# Patient Record
Sex: Male | Born: 1985 | Hispanic: Yes | Marital: Single
Health system: Southern US, Community
[De-identification: ages and names within clinical notes are randomized; demographics above are authoritative.]

## PROBLEM LIST (undated history)

## (undated) DIAGNOSIS — R7989 Other specified abnormal findings of blood chemistry: Secondary | ICD-10-CM

## (undated) HISTORY — DX: Other specified abnormal findings of blood chemistry: R79.89

---

## 2018-11-24 ENCOUNTER — Ambulatory Visit
Admission: RE | Admit: 2018-11-24 | Discharge: 2018-11-24 | Disposition: A | Discharge status: Home / Self Care | Payer: BC Managed Care – PPO | Source: Ambulatory Visit | Attending: Family Medicine | Admitting: Family Medicine

## 2018-11-24 ENCOUNTER — Other Ambulatory Visit: Payer: Self-pay | Admitting: Family Medicine

## 2018-11-24 DIAGNOSIS — R042 Hemoptysis: Secondary | ICD-10-CM

## 2019-01-23 ENCOUNTER — Other Ambulatory Visit: Payer: Self-pay

## 2019-01-23 ENCOUNTER — Other Ambulatory Visit: Payer: Self-pay | Admitting: Family Medicine

## 2019-01-23 ENCOUNTER — Ambulatory Visit
Admission: RE | Admit: 2019-01-23 | Discharge: 2019-01-23 | Disposition: A | Discharge status: Home / Self Care | Payer: BC Managed Care – PPO | Source: Ambulatory Visit | Attending: Family Medicine | Admitting: Family Medicine

## 2019-01-23 DIAGNOSIS — M7989 Other specified soft tissue disorders: Secondary | ICD-10-CM

## 2019-01-23 DIAGNOSIS — M79641 Pain in right hand: Secondary | ICD-10-CM

## 2019-01-23 DIAGNOSIS — M25431 Effusion, right wrist: Secondary | ICD-10-CM

## 2019-03-15 ENCOUNTER — Telehealth: Payer: Self-pay | Admitting: Neurology

## 2019-03-15 NOTE — Telephone Encounter (Signed)

## 2019-03-20 ENCOUNTER — Encounter: Payer: Self-pay | Admitting: Neurology

## 2019-03-20 NOTE — Telephone Encounter (Signed)
I called pt to update his chart. No answer, left a message asking him to call me back. 

## 2019-03-20 NOTE — Telephone Encounter (Signed)
I called pt. Pt's meds, allergies, and PMH were updated.  Pt has never has a sleep study but does endorse snoring.  Pt does not know what he weighs. He thinks it is about 300 lbs. He is 5'11.  Pt was instructed on how to measure his neck size prior to the appt tomorrow.  Pt understands the doxy.me process.  Epworth Sleepiness Scale 0= would never doze 1= slight chance of dozing 2= moderate chance of dozing 3= high chance of dozing  Sitting and reading: 2 Watching TV: 1 Sitting inactive in a public place (ex. Theater or meeting): 1 As a passenger in a car for an hour without a break: 1 Lying down to rest in the afternoon: 2 Sitting and talking to someone: 0 Sitting quietly after lunch (no alcohol): 1 In a car, while stopped in traffic: 0 Total: 8  FSS:17

## 2019-03-21 ENCOUNTER — Ambulatory Visit (INDEPENDENT_AMBULATORY_CARE_PROVIDER_SITE_OTHER): Payer: BC Managed Care – PPO | Admitting: Neurology

## 2019-03-21 ENCOUNTER — Other Ambulatory Visit: Payer: Self-pay

## 2019-03-21 DIAGNOSIS — R519 Headache, unspecified: Secondary | ICD-10-CM

## 2019-03-21 DIAGNOSIS — G479 Sleep disorder, unspecified: Secondary | ICD-10-CM

## 2019-03-21 DIAGNOSIS — R51 Headache: Secondary | ICD-10-CM | POA: Diagnosis not present

## 2019-03-21 DIAGNOSIS — E669 Obesity, unspecified: Secondary | ICD-10-CM | POA: Diagnosis not present

## 2019-03-21 DIAGNOSIS — G4726 Circadian rhythm sleep disorder, shift work type: Secondary | ICD-10-CM

## 2019-03-21 DIAGNOSIS — R0683 Snoring: Secondary | ICD-10-CM

## 2019-03-21 NOTE — Progress Notes (Signed)
[name], MD, PhD [facility] [address], [address] [place], [place] [address]   Virtual Visit via Video Note on 03/21/19  I connected with Albert Ramos on 03/21/19 at  3:30 PM EDT by a video enabled telemedicine application and verified that I am speaking with the correct person using two identifiers.   I discussed the limitations of evaluation and management by telemedicine and the availability of in person appointments. The patient expressed understanding and agreed to proceed.  History of Present Illness: Mr. Albert Ramos is a 33 year old right-handed gentleman with an underlying medical history of hypogonadism, and obesity, who presents for a virtual, video based appointment via doxy.me for evaluation of his sleep disorder, in particular, concern for underlying obstructive sleep apnea.  The patient is unaccompanied today.  He joins via cell phone from home, I am located in my office.  He is referred by his primary care physician, Dr. Rachell Cipro and I reviewed her telemedicine note from 03/07/2019 he reports snoring, Epworth sleepiness score is 8 out of 24, fatigue severity score is 17 out of 63.  He reports snoring but it is generally mild, his fiance is in the background adding some more information.  He has been working night shift, works from 6 PM to 6 AM generally speaking, he works for the department of [employer], [employer].  Bedtime is generally between 7 and 8 AM and usually he would wake up in the afternoon somewhere around 2 PM but lately he has woken up after 2 or 3 hours, sleep is interrupted.  He denies any night to night nocturia or having to use the bathroom once he goes to bed, he denies restless leg symptoms but has woken up with a headache at times.  He has been working night shift for the past year at least.  He has not tried any over-the-counter medication except for the occasional Benadryl.  He is not aware of any family history of  OSA.  He was not able to measure his neck circumference, he estimates that his latest weight was somewhere between 290 and 300 pounds.  He has gained weight in the recent past.  He drinks caffeine and limitation, 1 cup of coffee per day on average.    His Past Medical History Is Significant For: Past Medical History:  Diagnosis Date   Low testosterone     His Past Surgical History Is Significant For:   His Family History Is Significant For: Family History  Problem Relation Age of Onset   Diabetes Mother     His Social History Is Significant For: Social History   Socioeconomic History   Marital status: Single    Spouse name: Not on file   Number of children: Not on file   Years of education: Not on file   Highest education level: Not on file  Occupational History   Not on file  Social Needs   Financial resource strain: Not on file   Food insecurity:    Worry: Not on file    Inability: Not on file   Transportation needs:    Medical: Not on file    Non-medical: Not on file  Tobacco Use   Smoking status: Not on file  Substance and Sexual Activity   Alcohol use: Not on file   Drug use: Not on file   Sexual activity: Not on file  Lifestyle   Physical activity:    Days per week: Not on file  Minutes per session: Not on file   Stress: Not on file  Relationships   Social connections:    Talks on phone: Not on file    Gets together: Not on file    Attends religious service: Not on file    Active member of club or organization: Not on file    Attends meetings of clubs or organizations: Not on file    Relationship status: Not on file  Other Topics Concern   Not on file  Social History Narrative   Not on file    His Allergies Are:  No Known Allergies:   His Current Medications Are:  Outpatient Encounter Medications as of 03/21/2019  Medication Sig   albuterol (VENTOLIN HFA) 108 (90 Base) MCG/ACT inhaler Inhale into the lungs every 6 (six)  hours as needed for wheezing or shortness of breath.   diclofenac sodium (VOLTAREN) 1 % GEL Apply topically 4 (four) times daily.   meloxicam (MOBIC) 7.5 MG tablet Take 7.5 mg by mouth daily.   montelukast (SINGULAIR) 10 MG tablet Take 10 mg by mouth at bedtime.   testosterone (ANDROGEL) 50 MG/5GM (1%) GEL Place 5 g onto the skin daily.   No facility-administered encounter medications on file as of 03/21/2019.   :   Review of Systems:  Out of a complete 14 point review of systems, all are reviewed and negative with the exception of these symptoms as listed below:  Observations/Objective: On examination, he is pleasant and conversant, in no acute distress, comprehension is good, language skills unremarkable, speech is clear without dysarthria, hypophonia or voice tremor noted, airway examination reveals moderately crowded airway secondary to tonsillar size of about 2+, small airway entry noted, otherwise benign findings, no overbite. Neck mobility grossly intact, face is symmetric with normal facial animation noted, extraocular movements well preserved in all directions.  Upper body muscle bulk and coordination and movements grossly intact, he is able to walk around without problems.   Assessment and Plan: Albert Ramos is a 33 year old right-handed gentleman with an underlying medical history of hypogonadism, and obesity, who presents for a virtual, video based appointment via doxy.me for evaluation of his sleep disorder, in particular, concern for underlying obstructive sleep apnea. The patient's medical history and physical exam (albeit limited with current video-based evaluation) are concerning for a diagnosis of obstructive sleep apnea. I discussed with the patient the diagnosis of OSA, its prognosis and treatment options. I explained in particular the risks and ramifications of untreated moderate to severe OSA, especially with respect to developing cardiovascular disease down  the Road, including congestive heart failure, difficult to treat hypertension, cardiac arrhythmias, or stroke. Even type 2 diabetes has, in part, been linked to untreated OSA. Symptoms of untreated OSA may include daytime sleepiness, memory problems, mood irritability and mood disorder such as depression and anxiety, lack of energy, as well as recurrent headaches, especially morning headaches. We talked about the importance of weight control. We talked about the importance of maintaining good sleep hygiene. I recommended the following at this time: home sleep test.  I explained the sleep test procedure to the patient and also outlined possible treatment options of OSA, including the use of a custom-made dental device (which would require a referral to a specialist dentist), upper airway surgical options, (such as UPPP, which would involve a referral to an ENT). I also explained the CPAP vs. AutoPAP treatment option to the patient, who indicated that he would be willing to try CPAP if the  need arises. I answered all his questions today and the patient was in agreement. I plan to see the patient back after the sleep study is completed and encouraged him to call with any interim questions, concerns, problems or updates.   Huston FoleySaima Bruce Churilla, MD, PhD    Follow Up Instructions:    I discussed the assessment and treatment plan with the patient. The patient was provided an opportunity to ask questions and all were answered. The patient agreed with the plan and demonstrated an understanding of the instructions.   The patient was advised to call back or seek an in-person evaluation if the symptoms worsen or if the condition fails to improve as anticipated.  I provided 30 minutes of non-face-to-face time during this encounter.   Huston FoleySaima Evann Erazo, MD

## 2019-03-22 ENCOUNTER — Encounter: Payer: Self-pay | Admitting: Neurology

## 2019-03-22 NOTE — Patient Instructions (Signed)
Given verbally, during today's virtual video-based encounter, with verbal feedback received.   

## 2019-04-09 ENCOUNTER — Other Ambulatory Visit: Payer: Self-pay | Admitting: Family Medicine

## 2019-04-09 DIAGNOSIS — R1084 Generalized abdominal pain: Secondary | ICD-10-CM

## 2019-04-10 ENCOUNTER — Ambulatory Visit (INDEPENDENT_AMBULATORY_CARE_PROVIDER_SITE_OTHER): Payer: BC Managed Care – PPO | Admitting: Neurology

## 2019-04-10 DIAGNOSIS — R0683 Snoring: Secondary | ICD-10-CM

## 2019-04-10 DIAGNOSIS — E669 Obesity, unspecified: Secondary | ICD-10-CM

## 2019-04-10 DIAGNOSIS — G4726 Circadian rhythm sleep disorder, shift work type: Secondary | ICD-10-CM

## 2019-04-10 DIAGNOSIS — G479 Sleep disorder, unspecified: Secondary | ICD-10-CM

## 2019-04-10 DIAGNOSIS — G478 Other sleep disorders: Secondary | ICD-10-CM | POA: Diagnosis not present

## 2019-04-10 DIAGNOSIS — R519 Headache, unspecified: Secondary | ICD-10-CM

## 2019-04-12 NOTE — Procedures (Signed)
PATIENT'S NAME:  Albert, Ramos DOB:      1985/12/07      MR#:    [id number]     DATE OF RECORDING: 04/10/2019 REFERRING M.D.:  Rachell Cipro, MD Study Performed:   Baseline Polysomnogram HISTORY: 33 year old man with a history of hypogonadism, and obesity, who reports snoring and sleep disruption. The patient endorsed the Epworth Sleepiness Scale at 8/24 points. 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 The patient's weight 290 pounds with a height of 72 (inches), resulting in a BMI of 39.4 kg/m2. The patient's neck circumference measured 18 inches.  CURRENT MEDICATIONS: Albuterol, Diclofenac Sodium, Meloxicam, Montelukast and Testosterone.   PROCEDURE:  This is a multichannel digital polysomnogram utilizing the Somnostar 11.2 system.  Electrodes and sensors were applied and monitored per AASM Specifications.   EEG, EOG, Chin and Limb EMG, were sampled at 200 Hz.  ECG, Snore and Nasal Pressure, Thermal Airflow, Respiratory Effort, CPAP Flow and Pressure, Oximetry was sampled at 50 Hz. Digital video and audio were recorded.      BASELINE STUDY  Lights Out was at 08:07 and Lights On at 14:27.  Total recording time (TRT) was 381 minutes, with a total sleep time (TST) of 317 minutes.   The patient's sleep latency was 3 minutes.  REM latency was 50.5 minutes, which is mildly reduced. The sleep efficiency was 83.2 %.     SLEEP ARCHITECTURE: WASO (Wake after sleep onset) was 59.5 minutes with mild sleep fragmentation noted. There were 12 minutes in Stage N1, 173 minutes Stage N2, 66.5 minutes Stage N3 and 65.5 minutes in Stage REM.  The percentage of Stage N1 was 3.8%, Stage N2 was 54.6%, which is normal, Stage N3 was 21.%, which is normal, and Stage R (REM sleep) was 20.7%, which is normal. The arousals were noted as: 26 were spontaneous, 0 were associated with PLMs, 11 were associated with respiratory events.  RESPIRATORY ANALYSIS:  There were a total of 21 respiratory events:   0 obstructive apneas, 0 central apneas and 0 mixed apneas with a total of 0 apneas and an apnea index (AI) of 0 /hour. There were 21 hypopneas with a hypopnea index of 4. /hour. The patient also had 0 respiratory event related arousals (RERAs).      The total APNEA/HYPOPNEA INDEX (AHI) was 4. /hour and the total RESPIRATORY DISTURBANCE INDEX was 4. /hour.  15 events occurred in REM sleep and 12 events in NREM. The REM AHI was 13.7 /hour, versus a non-REM AHI of 1.4. The patient spent 16.5 minutes of total sleep time in the supine position and 301 minutes in non-supine.. The supine AHI was 7.3 versus a non-supine AHI of 3.8.  OXYGEN SATURATION & C02:  The Wake baseline 02 saturation was 94%, with the lowest being 87%. Time spent below 89% saturation equaled 1 minutes.   PERIODIC LIMB MOVEMENTS: The patient had a total of 0 Periodic Limb Movements.  The Periodic Limb Movement (PLM) index was 0 and the PLM Arousal index was 0/hour. Audio and video analysis did not show any abnormal or unusual movements, behaviors, phonations or vocalizations. The patient took 1 bathroom break. Mild to moderate snoring was noted. The EKG was in keeping with normal sinus rhythm (NSR).   Post-study, the patient indicated that sleep was the same as usual.   IMPRESSION:  1. Primary Snoring  RECOMMENDATIONS:  1. This study does not demonstrate any significant obstructive or central sleep disordered breathing with the exception  of mild to moderate snoring and mild, REM and supine related OSA. For this, treatment with positive airway pressure is not warranted. Weight loss and the avoidance of the supine sleep position is recommended. 2. The patient should be cautioned not to drive, work at heights, or operate dangerous or heavy equipment when tired or sleepy. Review and reiteration of good sleep hygiene measures should be pursued with any patient. 3. The patient will be advised to follow up with the referring provider, who  will be notified of the test results.  I certify that I have reviewed the entire raw data recording prior to the issuance of this report in accordance with the Standards of Accreditation of the American Academy of Sleep Medicine (AASM)  Huston FoleySaima Yamen Castrogiovanni, MD, PhD Diplomat, American Board of Neurology and Sleep Medicine (Neurology and Sleep Medicine)

## 2019-04-17 ENCOUNTER — Telehealth: Payer: Self-pay

## 2019-04-17 DIAGNOSIS — G4726 Circadian rhythm sleep disorder, shift work type: Secondary | ICD-10-CM

## 2019-04-17 DIAGNOSIS — G4710 Hypersomnia, unspecified: Secondary | ICD-10-CM

## 2019-04-17 NOTE — Telephone Encounter (Signed)
PT return call back from RN. Please call [phone].

## 2019-04-17 NOTE — Progress Notes (Signed)
Patient referred by Dr. Ernie Hew, seen by me on 03/21/19 in VV, diagnostic PSG on 04/10/19, daytime d/t shift work Hx.   Please call and notify the patient that the recent sleep study did not show any significant obstructive or central sleep apnea, with the exception of mild to moderate snoring and mild, REM and supine related OSA. For this, treatment with positive airway pressure is not warranted. Weight loss and the avoidance of the supine sleep position is recommended. Please remind patient to try to maintain good sleep hygiene, which means: Keep a regular sleep and wake schedule and make enough time for sleep (7 1/2 to 8 1/2 hours for the average adult), try not to exercise or have a meal within 2 hours of your bedtime, try to keep your bedroom conducive for sleep, that is, cool and dark, without light distractors such as an illuminated alarm clock, and refrain from watching TV right before sleep or in the middle of the night and do not keep the TV or radio on during the night. If a nightlight is used, have it away from the visual field. Also, try not to use or play on electronic devices at bedtime, such as your cell phone, tablet PC or laptop. If you like to read at bedtime on an electronic device, try to dim the background light as much as possible. Do not eat in the middle of the night. Keep pets away from the bedroom environment. For stress relief, try meditation, deep breathing exercises (there are many books and CDs available), a white noise machine or fan can help to diffuse other noise distractors, such as traffic noise. Do not drink alcohol before bedtime, as it can disturb sleep and cause middle of the night awakenings. Never mix alcohol and sedating medications! Avoid narcotic pain medication close to bedtime, as opioids/narcotics can suppress breathing drive and breathing effort.    He can FU with Dr. Ernie Hew.  Thanks,  [name], MD, PhD [facility])

## 2019-04-17 NOTE — Telephone Encounter (Addendum)
I called pt. I advised pt that Dr. Rexene Alberts reviewed pt's sleep study and found that does not have any significant osa. Dr. Rexene Alberts recommends that pt avoid sleeping on his back and pursue weight loss. I reviewed sleep hygiene recommendations with the pt, including trying to keep a regular sleep wake schedule, avoiding electronics in the bedroom, keeping the bedroom cool, dark, and quiet, and avoiding eating or exercising within 2 hours of bedtime as well as eating in the middle of the night. I advised pt to keep pets out of the bedroom. I discussed with pt the importance of stress relief and to try meditation, deep breathing exercises, and/or a white noise machine or fan to diffuse other noise distractors. I advised pt to not drink alcohol before bedtime and to never mix alcohol and sedating medications. Pt was advised to avoid narcotic pain medication close to bedtime. I advised pt that a copy of these sleep study results will be sent to Dr. Ernie Hew. Pt verbalized understanding of results.  Pt wants to know if he can take something to help with his sleepiness at work. 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

## 2019-04-17 NOTE — Telephone Encounter (Signed)
I called pt to discuss his sleep study results. No answer, left a message asking him to call me back. 

## 2019-04-17 NOTE — Telephone Encounter (Signed)
-----   Message from [name], MD sent at 04/17/2019  8:03 AM EDT ----- Patient referred by Dr. Ernie Hew, seen by me on 03/21/19 in VV, diagnostic PSG on 04/10/19, daytime d/t shift work Hx.   Please call and notify the patient that the recent sleep study did not show any significant obstructive or central sleep apnea, with the exception of mild to moderate snoring and mild, REM and supine related OSA. For this, treatment with positive airway pressure is not warranted. Weight loss and the avoidance of the supine sleep position is recommended. Please remind patient to try to maintain good sleep hygiene, which means: Keep a regular sleep and wake schedule and make enough time for sleep (7 1/2 to 8 1/2 hours for the average adult), try not to exercise or have a meal within 2 hours of your bedtime, try to keep your bedroom conducive for sleep, that is, cool and dark, without light distractors such as an illuminated alarm clock, and refrain from watching TV right before sleep or in the middle of the night and do not keep the TV or radio on during the night. If a nightlight is used, have it away from the visual field. Also, try not to use or play on electronic devices at bedtime, such as your cell phone, tablet PC or laptop. If you like to read at bedtime on an electronic device, try to dim the background light as much as possible. Do not eat in the middle of the night. Keep pets away from the bedroom environment. For stress relief, try meditation, deep breathing exercises (there are many books and CDs available), a white noise machine or fan can help to diffuse other noise distractors, such as traffic noise. Do not drink alcohol before bedtime, as it can disturb sleep and cause middle of the night awakenings. Never mix alcohol and sedating medications! Avoid narcotic pain medication close to bedtime, as opioids/narcotics can suppress breathing drive and breathing effort.    He can FU with Dr. Ernie Hew.  Thanks,  [name], MD, PhD [facility])

## 2019-04-18 NOTE — Telephone Encounter (Signed)
I would recommend he increase his sleep time to 8 hours per day and if this does not work, we can consider bringing him in for a PSG and next day MSLT to look for an organic sleepiness disorder. Please advise patient, we can submit for a new sleep study request, looking for underlying sleepiness disorder.

## 2019-04-18 NOTE — Addendum Note (Signed)
Addended by: [name] on: 04/18/2019 02:18 PM   Modules accepted: Orders

## 2019-04-18 NOTE — Telephone Encounter (Signed)
I called pt and discussed this with him. He has tried to increase his sleep time to 8 hours and feels that this does not work. He would be agreeable to a PSG and MSLT if his insurance will cover it. I advised him that our sleep lab will submit a request with insurance for this.

## 2019-04-18 NOTE — Telephone Encounter (Signed)
I will order psg/mslt.

## 2019-04-20 ENCOUNTER — Other Ambulatory Visit: Payer: BC Managed Care – PPO

## 2019-04-27 ENCOUNTER — Ambulatory Visit
Admission: RE | Admit: 2019-04-27 | Discharge: 2019-04-27 | Disposition: A | Discharge status: Home / Self Care | Payer: BC Managed Care – PPO | Source: Ambulatory Visit | Attending: Family Medicine | Admitting: Family Medicine

## 2019-04-27 DIAGNOSIS — R1084 Generalized abdominal pain: Secondary | ICD-10-CM

## 2019-05-21 ENCOUNTER — Ambulatory Visit (INDEPENDENT_AMBULATORY_CARE_PROVIDER_SITE_OTHER): Payer: BC Managed Care – PPO | Admitting: Neurology

## 2019-05-21 DIAGNOSIS — G4726 Circadian rhythm sleep disorder, shift work type: Secondary | ICD-10-CM

## 2019-05-21 DIAGNOSIS — G4720 Circadian rhythm sleep disorder, unspecified type: Secondary | ICD-10-CM

## 2019-05-21 DIAGNOSIS — G4733 Obstructive sleep apnea (adult) (pediatric): Secondary | ICD-10-CM

## 2019-05-21 DIAGNOSIS — G4710 Hypersomnia, unspecified: Secondary | ICD-10-CM

## 2019-05-28 ENCOUNTER — Telehealth: Payer: Self-pay

## 2019-05-28 NOTE — Addendum Note (Signed)
Addended by: [name] on: 05/28/2019 08:08 AM   Modules accepted: Orders

## 2019-05-28 NOTE — Telephone Encounter (Signed)
-----   Message from [name], MD sent at 05/28/2019  8:08 AM EDT ----- Patient referred by Dr. Ernie Hew, seen by me on 03/21/19, diagnostic PSG during the day recently neg. For OSA, repeat study requested with next day MSLT, on 05/21/19.    Please call and notify the patient that the recent sleep study showed mild OSA. We canceled MSLT due to TST of less than 6 h and he took flexeril prior to study. OSA is overall mild, but worth treating to see if he feels better after treatment. To that end I recommend treatment for this in the form of autoPAP, which means, that we don't have to bring him back for a second sleep study with CPAP, but will let him try an autoPAP machine at home, through a DME company (of his choice, or as per insurance requirement). The DME representative will educate him on how to use the machine, how to put the mask on, etc. I have placed an order in the chart. Please send referral, talk to patient, send report to referring MD. We will need a FU in sleep clinic for 10 weeks post-PAP set up, please arrange that with me or one of our NPs. Thanks,   [name], MD, PhD [facility])

## 2019-05-28 NOTE — Procedures (Signed)
PATIENT'S NAME:  Albert Ramos, [place] DOB:      [birth year]      MR#:    [id number]     DATE OF RECORDING: 05/21/2019 REFERRING M.D.:  Rachell Cipro, MD Study Performed:   Baseline Polysomnogram HISTORY: 33 year old man with a history of hypogonadism, and obesity, who presents for a repeat sleep study and next day MSLT for his daytime sleepiness. He had a recent daytime sleep study. He is a shift Insurance underwriter. The patient endorsed the Epworth Sleepiness Scale at 8 points. The patient's weight 291 pounds with a height of 72 (inches), resulting in a BMI of 39.4 kg/m2. The patient's neck circumference measured 18 inches.  CURRENT MEDICATIONS: Ventolin, Voltaren, Mobic, Singulair, Androgel   PROCEDURE:  This is a multichannel digital polysomnogram utilizing the Somnostar 11.2 system.  Electrodes and sensors were applied and monitored per AASM Specifications.   EEG, EOG, Chin and Limb EMG, were sampled at 200 Hz.  ECG, Snore and Nasal Pressure, Thermal Airflow, Respiratory Effort, CPAP Flow and Pressure, Oximetry was sampled at 50 Hz. Digital video and audio were recorded.      BASELINE STUDY  The patient took generic Flexeil prior to study start. Lights Out was at 21:56 and Lights On at 05:50.  Total recording time (TRT) was 474.5 minutes, with a total sleep time (TST) of 326.5 minutes.   The patient's sleep latency was 9 minutes.  REM latency was 172 minutes, which is delayed. The sleep efficiency was 68.8%, which is reduced.     SLEEP ARCHITECTURE: WASO (Wake after sleep onset) was 100.5 minutes.  There were 63.5 minutes in Stage N1, 190.5 minutes Stage N2, 45.5 minutes Stage N3 and 27 minutes in Stage REM.  The percentage of Stage N1 was 19.4%, which is markedly increased, Stage N2 was 58.3%, which is increased, Stage N3 was 13.9% and Stage R (REM sleep) was 8.3%, which is reduced.   RESPIRATORY ANALYSIS:  There were a total of 33 respiratory events:  0 obstructive apneas, 0 central apneas and 0 mixed  apneas with a total of 0 apneas and an apnea index (AI) of 0 /hour. There were 33 hypopneas with a hypopnea index of 6.1 /hour. The patient also had 0 respiratory event related arousals (RERAs).      The total APNEA/HYPOPNEA INDEX (AHI) was 6.1 /hour and the total RESPIRATORY DISTURBANCE INDEX was 0. 6.1 /hour.  6 events occurred in REM sleep and 54 events in NREM. The REM AHI was 13.3 /hour, versus a non-REM AHI of 5.4. The patient spent 51 minutes of total sleep time in the supine position and 276 minutes in non-supine.. The supine AHI was 18.8 versus a non-supine AHI of 3.7.  OXYGEN SATURATION & C02:  The Wake baseline 02 saturation was 97%, with the lowest being 89%. Time spent below 89% saturation equaled 0 minutes.   PERIODIC LIMB MOVEMENTS: The patient had a total of 27 Periodic Limb Movements.  The Periodic Limb Movement (PLM) index was 5. and the PLM Arousal index was .7/hour. The arousals were noted as: 67 were spontaneous, 4 were associated with PLMs, 17 were associated with respiratory events.  Audio and video analysis did not show any abnormal or unusual movements, behaviors, phonations or vocalizations. The patient took 1 bathroom break. Mild to moderate snoring was noted. The EKG was in keeping with normal sinus rhythm (NSR). Post-study, the patient indicated that sleep was the same as usual.   IMPRESSION:  1. Obstructive Sleep Apnea (OSA) 2. Dysfunctions  associated with sleep stages or arousal from sleep  RECOMMENDATIONS:  1. This study demonstrates overall mild obstructive sleep apnea (more pronounced in REM sleep and in the supine position) with an AHI of 6.1/hour, and O2 nadir of 89%. The patient was scheduled for a next day MSLT/nap study; however, due to a total sleep time of less than 6 hours and due to the patient taking flexeril before lights out, the next day MSLT was canceled. Given the patient's sleep related complaints, treatment of his mild OSA with positive airway  pressure is recommended; this can be achieved in the form of autoPAP. Alternatively, a full-night CPAP titration study would allow optimization of therapy if needed. Other treatment options may include avoidance of supine sleep position along with weight loss, upper airway or jaw surgery in selected patients or the use of an oral appliance in certain patients. ENT evaluation and/or consultation with a maxillofacial surgeon or dentist may be feasible in some instances.    2. Please note that untreated obstructive sleep apnea may carry additional perioperative morbidity. Patients with significant obstructive sleep apnea should receive perioperative PAP therapy and the surgeons and particularly the anesthesiologist should be informed of the diagnosis and the severity of the sleep disordered breathing. 3. This study shows sleep fragmentation and abnormal sleep stage percentages; these are nonspecific findings and per se do not signify an intrinsic sleep disorder or a cause for the patient's sleep-related symptoms. Causes include (but are not limited to) the first night effect of the sleep study, circadian rhythm disturbances, medication effect or an underlying mood disorder or medical problem.  4. The patient should be cautioned not to drive, work at heights, or operate dangerous or heavy equipment when tired or sleepy. Review and reiteration of good sleep hygiene measures should be pursued with any patient. 5. The patient will be seen in follow[facility] for discussion of the test results and further management strategies. The referring provider will be notified of the test results.  I certify that I have reviewed the entire raw data recording prior to the issuance of this report in accordance with the Standards of Accreditation of the American Academy of Sleep Medicine (AASM)  Huston FoleySaima Armoni Kludt, MD, PhD Diplomat, American Board of Neurology and Sleep Medicine (Neurology and Sleep Medicine)

## 2019-05-28 NOTE — Telephone Encounter (Signed)
I called pt. I advised pt that Dr. Rexene Alberts reviewed their sleep study results and found that pt has mild osa. Dr. Rexene Alberts recommends that pt start an auto pap at home. I reviewed PAP compliance expectations with the pt. Pt is agreeable to starting an auto-PAP. I advised pt that an order will be sent to a DME, [facility], and [facility] will call the pt within about one week after they file with the pt's insurance. [facility] will show the pt how to use the machine, fit for masks, and troubleshoot the auto-PAP if needed. Pt was unable to schedule his follow up at this time but will call us back. I advised him of the importance and insurance requirement of a 31-90 day follow up with our office after starting auto pap. A letter with all of this information in it will be mailed to the pt as a reminder. I verified with the pt that the address we have on file is correct. Pt verbalized understanding of results. Pt had no questions at this time but was encouraged to call back if questions arise. I have sent the order to [employer] and have received confirmation that they have received the order.

## 2019-05-28 NOTE — Progress Notes (Signed)
Patient referred by Dr. Ernie Hew, seen by me on 03/21/19, diagnostic PSG during the day recently neg. For OSA, repeat study requested with next day MSLT, on 05/21/19.    Please call and notify the patient that the recent sleep study showed mild OSA. We canceled MSLT due to TST of less than 6 h and he took flexeril prior to study. OSA is overall mild, but worth treating to see if he feels better after treatment. To that end I recommend treatment for this in the form of autoPAP, which means, that we don't have to bring him back for a second sleep study with CPAP, but will let him try an autoPAP machine at home, through a DME company (of his choice, or as per insurance requirement). The DME representative will educate him on how to use the machine, how to put the mask on, etc. I have placed an order in the chart. Please send referral, talk to patient, send report to referring MD. We will need a FU in sleep clinic for 10 weeks post-PAP set up, please arrange that with me or one of our NPs. Thanks,   Albert Age, MD, PhD [facility])

## 2019-08-23 NOTE — Telephone Encounter (Signed)
Received notice from [facility] that pt could not afford a cpap so they offered to give him a free machine with 1 set of free supplies but pt has declined this.  I called pt to discuss this. No answer, left a message asking him to call me back.

## 2021-12-10 ENCOUNTER — Emergency Department (HOSPITAL_BASED_OUTPATIENT_CLINIC_OR_DEPARTMENT_OTHER)
Admission: EM | Admit: 2021-12-10 | Discharge: 2021-12-10 | Disposition: A | Discharge status: Home / Self Care | Payer: Commercial Managed Care - HMO | Attending: Emergency Medicine | Admitting: Emergency Medicine

## 2021-12-10 ENCOUNTER — Emergency Department (HOSPITAL_BASED_OUTPATIENT_CLINIC_OR_DEPARTMENT_OTHER): Payer: Commercial Managed Care - HMO | Admitting: Radiology

## 2021-12-10 ENCOUNTER — Encounter (HOSPITAL_BASED_OUTPATIENT_CLINIC_OR_DEPARTMENT_OTHER): Payer: Self-pay | Admitting: Emergency Medicine

## 2021-12-10 ENCOUNTER — Other Ambulatory Visit (HOSPITAL_BASED_OUTPATIENT_CLINIC_OR_DEPARTMENT_OTHER): Payer: Self-pay

## 2021-12-10 ENCOUNTER — Other Ambulatory Visit: Payer: Self-pay

## 2021-12-10 DIAGNOSIS — M546 Pain in thoracic spine: Secondary | ICD-10-CM | POA: Insufficient documentation

## 2021-12-10 DIAGNOSIS — M549 Dorsalgia, unspecified: Secondary | ICD-10-CM

## 2021-12-10 DIAGNOSIS — M545 Low back pain, unspecified: Secondary | ICD-10-CM | POA: Diagnosis not present

## 2021-12-10 MED ORDER — LIDOCAINE 5 % EX PTCH: 1.0000 | MEDICATED_PATCH | Freq: Once | CUTANEOUS | Status: DC

## 2021-12-10 MED ORDER — METHOCARBAMOL 500 MG PO TABS
500.0000 mg | ORAL_TABLET | Freq: Two times a day (BID) | ORAL | 0 refills | Status: AC
  Filled 2021-12-10: qty 20, 10d supply, fill #0

## 2021-12-10 MED ORDER — METHOCARBAMOL 500 MG PO TABS
500.0000 mg | ORAL_TABLET | Freq: Once | ORAL | Status: AC
  Administered 2021-12-10: 500 mg via ORAL
  Filled 2021-12-10: qty 1

## 2021-12-10 MED ORDER — KETOROLAC TROMETHAMINE 15 MG/ML IJ SOLN
15.0000 mg | Freq: Once | INTRAMUSCULAR | Status: AC
  Administered 2021-12-10: 15 mg via INTRAMUSCULAR
  Filled 2021-12-10: qty 1

## 2021-12-10 MED ORDER — OXYCODONE-ACETAMINOPHEN 5-325 MG PO TABS
1.0000 | ORAL_TABLET | Freq: Once | ORAL | Status: AC
  Administered 2021-12-10: 1 via ORAL
  Filled 2021-12-10: qty 1

## 2021-12-10 NOTE — ED Notes (Signed)
Patient transported to X-ray 

## 2021-12-10 NOTE — ED Provider Notes (Signed)
[facility] EMERGENCY DEPT Provider Note   CSN: [id number] Arrival date & time: 12/10/21  2585     History Chief Complaint  Patient presents with   Back Pain    Albert Ramos is a 36 y.o. male who presents to the Emergency Department complaining of mid-left back pain onset prior to arrival.  Denies recent heavy lifting, injury, trauma.  Patient notes that he was asleep and woke up to extreme pain to his back.  Has not tried medication for his symptoms.  Denies bowel/bladder incontinence, fever, chills, chest pain, shortness of breath, abdominal pain, nausea, vomiting, hematuria, dysuria, saddle paresthesia, numbness, tingling, weakness.  Denies allergies to medications.  Patient has a history of lower back pain that he is being treated with gabapentin and meloxicam.  He has not seen his primary care provider or his orthopedist due to insurance changes.    The history is provided by the patient. No language interpreter was used.  Back Pain Location:  Thoracic spine Radiates to:  Does not radiate Pain severity:  Moderate Pain is:  Unable to specify Onset quality:  Sudden Chronicity:  New Context: not jumping from heights, not lifting heavy objects and not recent injury   Relieved by:  None tried Worsened by:  Movement Ineffective treatments:  None tried Associated symptoms: no abdominal pain, no chest pain, no dysuria, no fever, no numbness and no weakness       Home Medications Prior to Admission medications   Medication Sig Start Date End Date Taking? Authorizing Provider  methocarbamol (ROBAXIN) 500 MG tablet Take 1 tablet (500 mg total) by mouth 2 (two) times daily. 12/10/21  Yes [name], PA-C  albuterol (VENTOLIN HFA) 108 (90 Base) MCG/ACT inhaler Inhale into the lungs every 6 (six) hours as needed for wheezing or shortness of breath.    [provider]  diclofenac sodium (VOLTAREN) 1 % GEL Apply topically 4 (four) times daily.     [provider]  meloxicam (MOBIC) 7.5 MG tablet Take 7.5 mg by mouth daily.    [provider]  montelukast (SINGULAIR) 10 MG tablet Take 10 mg by mouth at bedtime.    [provider]  testosterone (ANDROGEL) 50 MG/5GM (1%) GEL Place 5 g onto the skin daily.    [provider]      Allergies    Patient has no known allergies.    Review of Systems   Review of Systems  Constitutional:  Negative for chills and fever.  HENT:  Negative for congestion and rhinorrhea.   Respiratory:  Negative for shortness of breath.   Cardiovascular:  Negative for chest pain.  Gastrointestinal:  Negative for abdominal pain, nausea and vomiting.  Genitourinary:  Negative for dysuria and hematuria.  Musculoskeletal:  Positive for back pain.  Skin:  Negative for rash.  Neurological:  Negative for weakness and numbness.  All other systems reviewed and are negative.  Physical Exam Updated Vital Signs BP 115/80 (BP Location: Right Arm)    Pulse 93    Temp 98.5 F (36.9 C) (Oral)    Resp 19    Ht 5\' 11"  (1.803 m)    Wt (!) 154.2 kg    SpO2 97%    BMI 47.42 kg/m  Physical Exam Vitals and nursing note reviewed.  Constitutional:      General: He is not in acute distress.    Appearance: He is not diaphoretic.  HENT:     Head: Normocephalic and atraumatic.  Mouth/Throat:     Pharynx: No oropharyngeal exudate.  Eyes:     General: No scleral icterus.    Conjunctiva/sclera: Conjunctivae normal.  Cardiovascular:     Rate and Rhythm: Normal rate and regular rhythm.     Pulses: Normal pulses.     Heart sounds: Normal heart sounds.  Pulmonary:     Effort: Pulmonary effort is normal. No respiratory distress.     Breath sounds: Normal breath sounds. No wheezing.  Abdominal:     General: Bowel sounds are normal.     Palpations: Abdomen is soft. There is no mass.     Tenderness: There is no abdominal tenderness. There is no right CVA tenderness, guarding or rebound.   Musculoskeletal:        General: Normal range of motion.     Cervical back: Normal, normal range of motion and neck supple.     Thoracic back: Tenderness and bony tenderness present. No swelling, edema, deformity, signs of trauma, lacerations or spasms.     Comments: No cervical spinal tenderness to palpation.  Tenderness to palpation noted to thoracic and upper lumbar spine.  Tenderness to palpation noted to musculature of left thoracic region.  Strength and sensation intact to bilateral upper and lower extremities.  Grip strength 5/5 bilaterally.  Skin:    General: Skin is warm and dry.  Neurological:     Mental Status: He is alert.     Sensory: Sensation is intact.  Psychiatric:        Behavior: Behavior normal.    ED Results / Procedures / Treatments   Labs (all labs ordered are listed, but only abnormal results are displayed) Labs Reviewed - No data to display  EKG None  Radiology DG Thoracic Spine W/Swimmers  Result Date: 12/10/2021 CLINICAL DATA:  Awoke at 0530 hours today with back pain, mainly LEFT thoracic EXAM: THORACIC SPINE - 3 VIEWS COMPARISON:  None FINDINGS: Twelve pairs of ribs. Osseous mineralization normal. Vertebral body and disc space heights maintained. No fracture, subluxation, or bone destruction. IMPRESSION: Normal exam. Electronically Signed   By: Ulyses SouthwardMark  Boles M.D.   On: 12/10/2021 08:25   DG Lumbar Spine Complete  Result Date: 12/10/2021 CLINICAL DATA:  Awoke at 0530 hours today with back pain, mainly LEFT thoracic EXAM: LUMBAR SPINE - COMPLETE 4+ VIEW COMPARISON:  None FINDINGS: Five non-rib-bearing lumbar vertebra. Osseous mineralization normal. Vertebral body and disc space heights maintained. No fracture, subluxation, or bone destruction. No spondylolysis. SI joints preserved. IMPRESSION: Normal exam. Electronically Signed   By: Ulyses SouthwardMark  Boles M.D.   On: 12/10/2021 08:24    Procedures Procedures    Medications Ordered in ED Medications  methocarbamol  (ROBAXIN) tablet 500 mg (500 mg Oral Given 12/10/21 0831)  ketorolac (TORADOL) 15 MG/ML injection 15 mg (15 mg Intramuscular Given 12/10/21 0832)  oxyCODONE-acetaminophen (PERCOCET/ROXICET) 5-325 MG per tablet 1 tablet (1 tablet Oral Given 12/10/21 0831)    ED Course/ Medical Decision Making/ A&P Clinical Course as of 12/10/21 1509  Thu Dec 10, 2021  0847 Discussed imaging findings and discharge treatment plan with patient and partner at bedside.  Both agreeable to discharge treatment plan at this time. [SB]  [phone] Patient noted improvement of symptoms with medications given in the ED.  Patient spouse noted that he had right upper arm twitching.  Radial pulses are intact, strength and sensation intact to right upper extremity.  Patent airway.  No tenderness to palpation noted to extremity.  Partner notes that patient has had  3+ weeks of random twitching of the extremities.  Denies history of seizures.  No notable seizure-like activity on my exam, patient's minimal twitching resolved at the end of our conversation.  Will provide on-call neurologist information for follow-up.  [SB]    Clinical Course User Index [SB] Breydan Shillingburg A, PA-C                           Medical Decision Making Amount and/or Complexity of Data Reviewed Radiology: ordered.  Risk Prescription drug management.   Patient with mid to left back pain onset this morning prior to arrival.  Denies recent injury, trauma, fall.  Patient notes that he was laying in bed when he turned over and felt a pop.  Vital signs stable, patient afebrile, not tachycardic or hypoxic. No neurological deficits and normal neuro exam. No loss of bowel or bladder control.  No concern for cauda equina.  No urinary symptoms suggestive of UTI.  Vital signs, stable, patient afebrile, not tachycardic or hypoxic. On exam, patient with tenderness to palpation noted to thoracic spine and musculature of left thoracic region.  No focal neurological deficits. Strength  and sensation intact to bilateral upper and lower extremities. Differential diagnosis includes but is not limited to fracture, herniation, muscle strain, muscle spasm, cauda equina, nephrolithiasis, acute cystitis.    Additional history obtained:  Additional history obtained from Spouse/Significant Other External records from outside source obtained and reviewed including: Patient was evaluated through [facility] for pain in the lumbar spine on 07/20/2021.  Patient takes gabapentin for his lumbar pain. Patient had a referral placed to neurosurgery on 06/08/2021 due to acute left-sided low back pain with left-sided sciatica.  Imaging: I ordered imaging studies including thoracic and lumbar spine x-ray I independently visualized and interpreted imaging which showed: No acute fracture or herniation. I agree with the radiologist interpretation  Medications:  I ordered medication including toradol, percocet, robaxin, lidoderm patch for pain management Reevaluation of the patient after these medicines and interventions, I reevaluated the patient and found that they have improved I have reviewed the patients home medicines and have made adjustments as needed  Disposition: Patient presentation suspicious for muscle spasm.  Doubt fracture or herniation, no acute radiographic findings.  Doubt nephrolithiasis or acute cystitis at this time, patient without urinary symptoms.  Doubt cauda equina, patient without saddle paresthesia or bowel/bladder incontinence.  Strength and sensation intact to bilateral lower extremities. After consideration of the diagnostic results and the patients response to treatment, I feel that the patient would benefit from Discharge home.  We will send patient home with a prescription for Robaxin.  Discussed with patient that he can continue taking his prescription meloxicam.  Discussed importance of following up with his primary care provider and neurosurgeon who has been following  him for his chronic low back pain.  Discussed importance of follow-up with primary care provider for management.  Supportive care measures and strict return precautions discussed with patient at bedside. Pt acknowledges and verbalizes understanding. Pt appears safe for discharge. Follow up as indicated in discharge paperwork.    This chart was dictated using voice recognition software, Dragon. Despite the best efforts of this provider to proofread and correct errors, errors may still occur which can change documentation meaning.  Final Clinical Impression(s) / ED Diagnoses Final diagnoses:  Back pain    Rx / DC Orders ED Discharge Orders          Ordered  methocarbamol (ROBAXIN) 500 MG tablet  2 times daily        12/10/21 0931              Bodee Lafoe A, PA-C 12/10/21 1509    Lorre Nick, MD 12/12/21 1235

## 2021-12-10 NOTE — Discharge Instructions (Addendum)
It was a pleasure taking care of you today!  ? ?Your x-ray was negative for fracture. You are prescribed Robaxin.   Do not operate any heavy machinery or drive while taking Robaxin.  You may apply heat to the affected area for up to 15 minutes at a time.  Ensure to place a barrier between your skin and the heat.  You may continue taking your prescription meloxicam as prescribed.  Attached is information for the on-call neurologist, you may call and set up a follow-up appointment today regarding the twitching episodes that you have noticed.  You may follow-up with your primary care provider as needed.  Return to the Emergency Department if you are experiencing loss of bowel or bladder, increasing/worsening symptoms, fever, inability to walk. ?

## 2021-12-10 NOTE — ED Triage Notes (Signed)
Pt arrived via EMS. Pt woke up to a "Pop" sound in his back as he was sleeping. Pt describes pain is located in the general area of his left scapula. Pt takes Gabapentin for chronic lower back pain. ?

## 2021-12-10 NOTE — ED Notes (Signed)
Patient verbalizes understanding of discharge instructions. Opportunity for questioning and answers were provided. Patient discharged from ED.  °

## 2023-05-25 IMAGING — DX DG LUMBAR SPINE COMPLETE 4+V
5 series · 5 of 5 positions shown · non-contrast
Comparison: None

CLINICAL DATA: Awoke at 4764 hours today with back pain, mainly
LEFT thoracic

EXAM:
LUMBAR SPINE - COMPLETE 4+ VIEW

[l-spine ap]
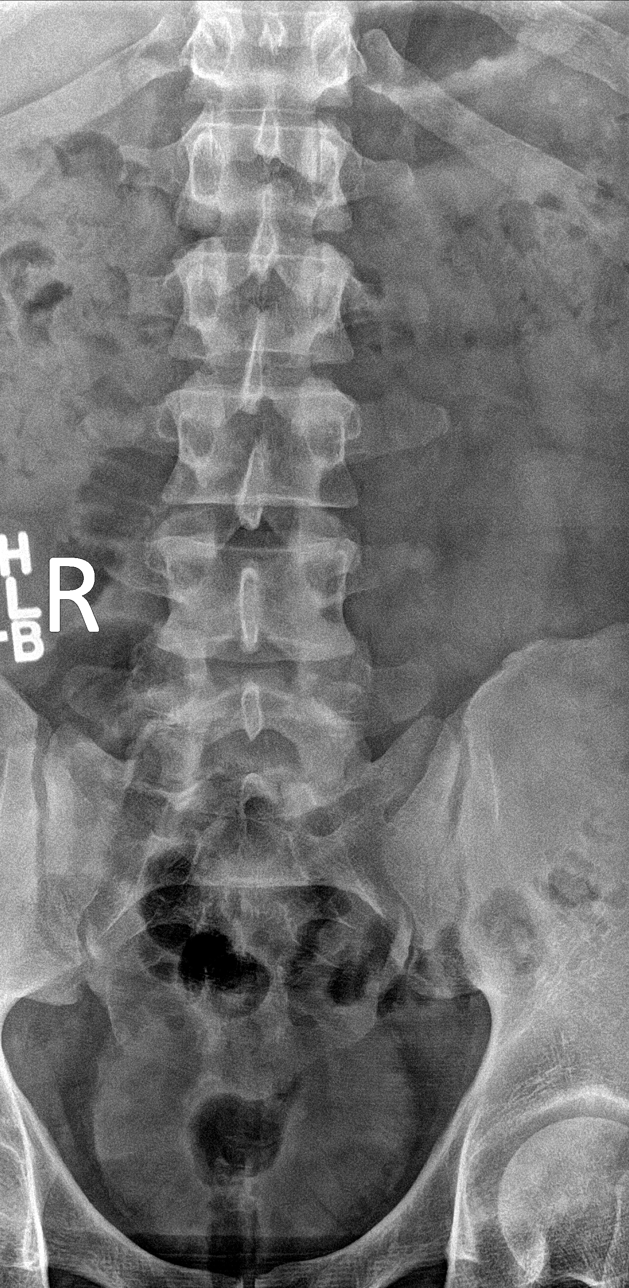

[l-spine obl (1 of 2)]
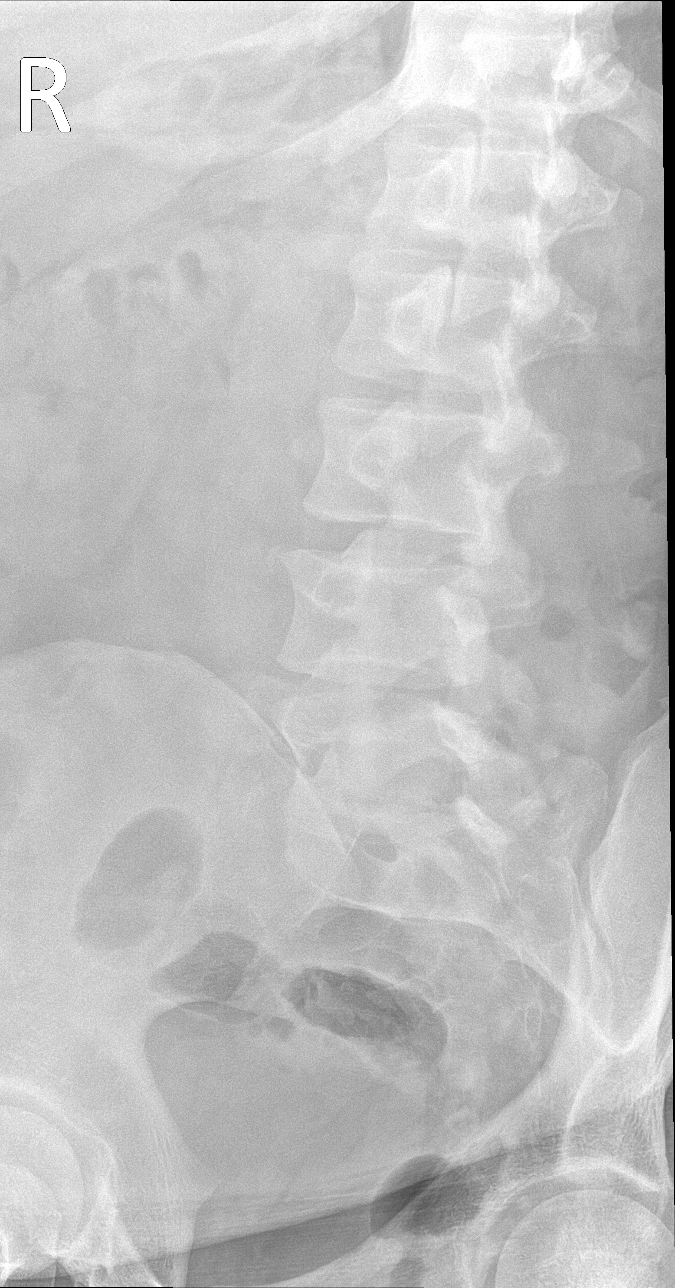

[l-spine obl (2 of 2)]
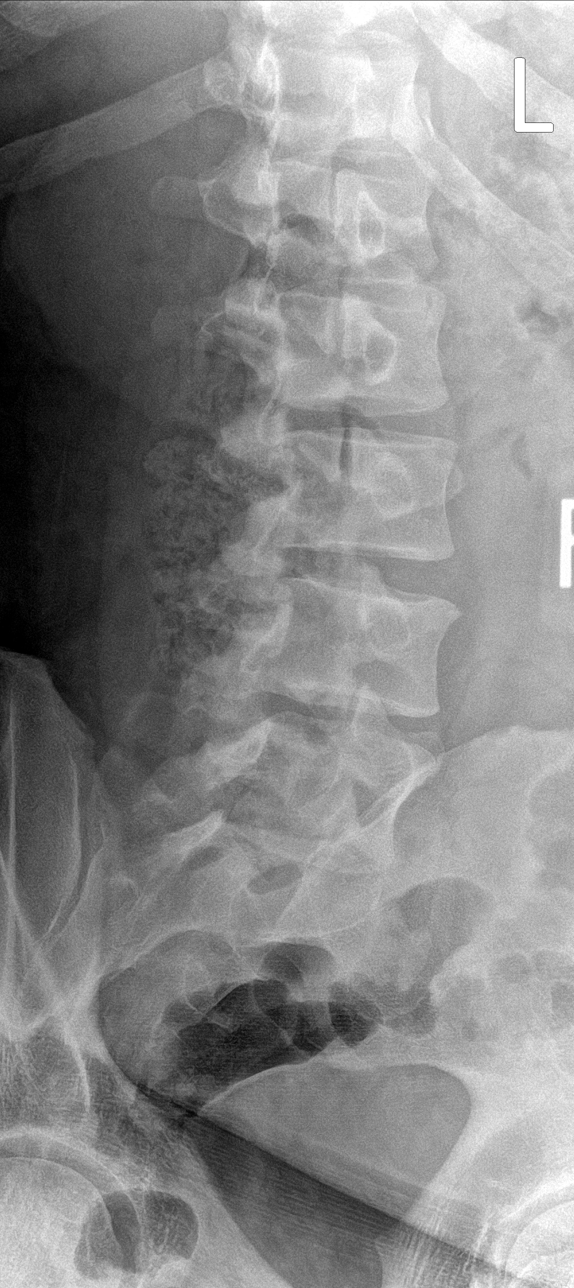

[l-spine lat]
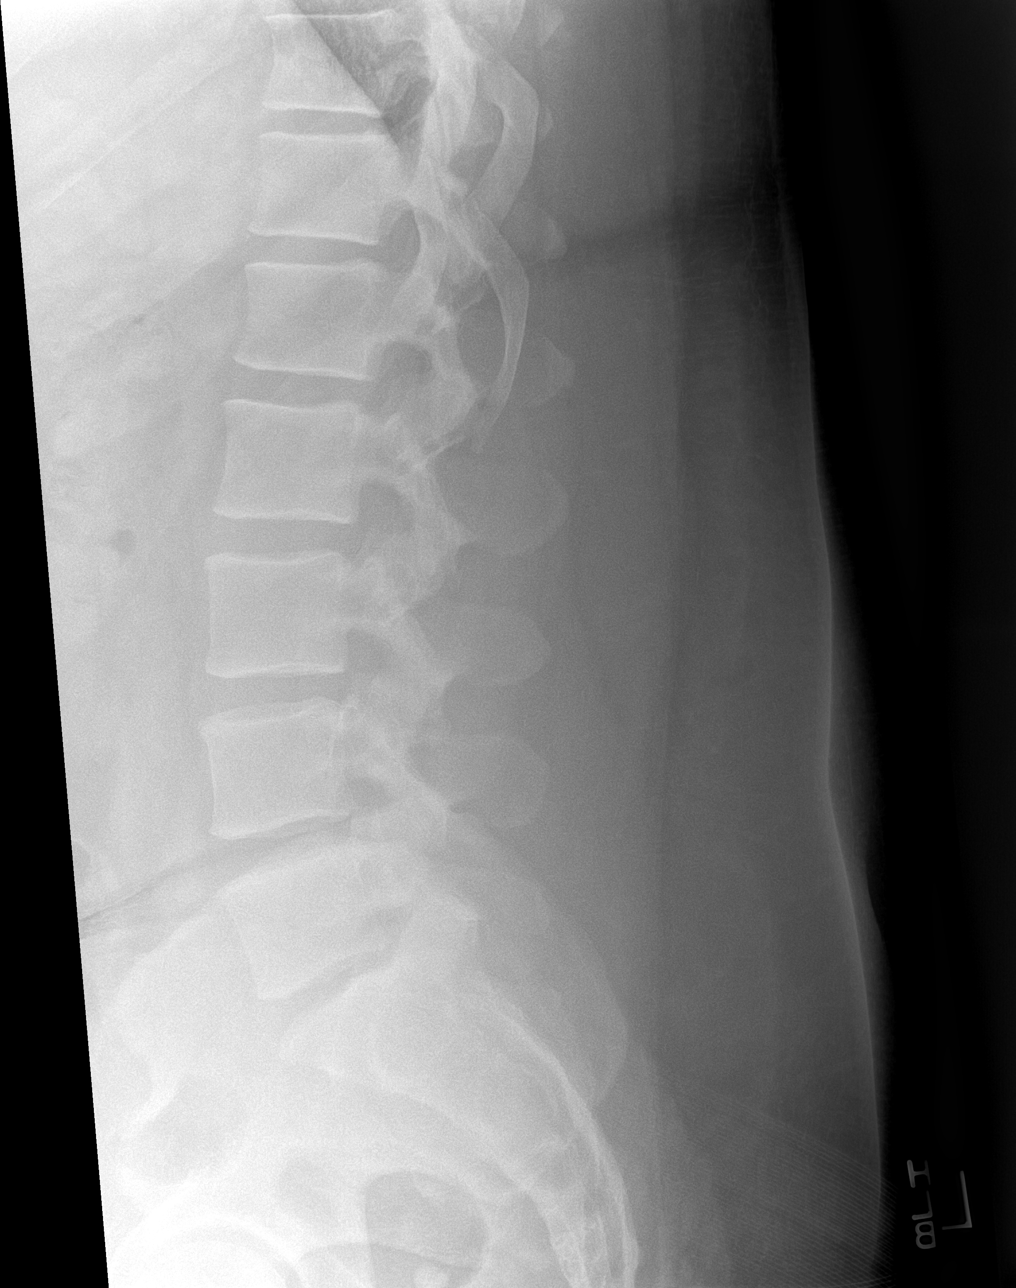

[l-spine spot]
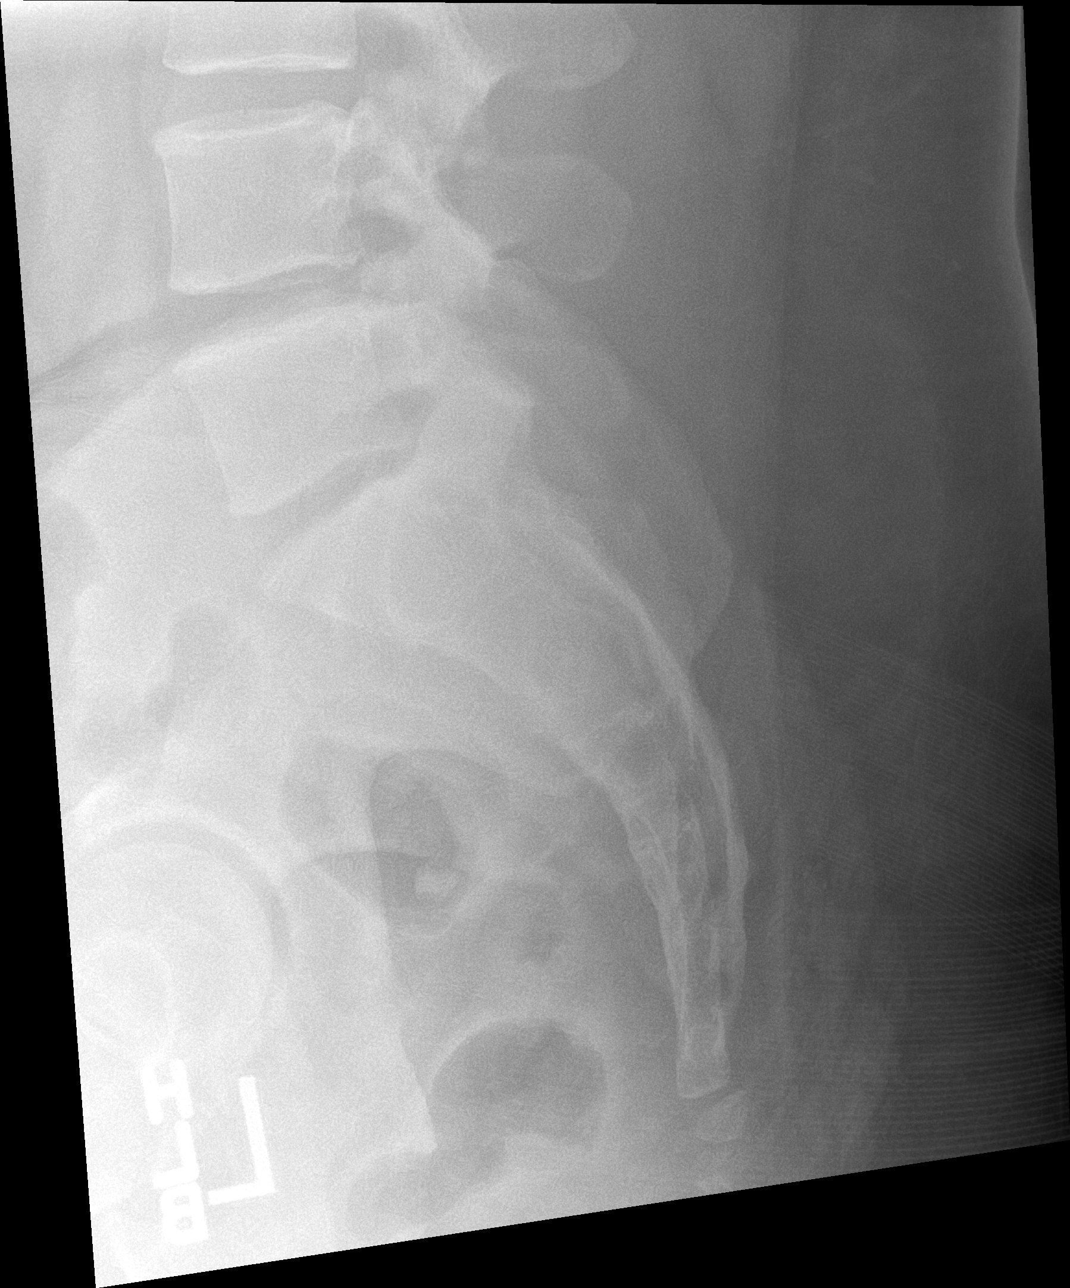

[5 of 5 positions shown; findings below may reference images not displayed]

FINDINGS: Five non-rib-bearing lumbar vertebra.

Osseous mineralization normal.

Vertebral body and disc space heights maintained.

No fracture, subluxation, or bone destruction.

No spondylolysis.

SI joints preserved.
IMPRESSION: Normal exam.
